# Patient Record
Sex: Female | Born: 1970 | Race: Black or African American | Hispanic: No | Marital: Single
Health system: Midwestern US, Academic
[De-identification: ages and names within clinical notes are randomized; demographics above are authoritative.]

## PROBLEM LIST (undated history)

## (undated) DIAGNOSIS — D259 Leiomyoma of uterus, unspecified: Secondary | ICD-10-CM

---

## 1996-06-21 HISTORY — PX: TUBAL LIGATION: SHX77

## 2006-02-28 NOTE — Unmapped (Signed)
THE [facility] PATIENT NAME:   Deborah Shaffer, CHHIM                     MR #:  [id number] DATE OF BIRTH:  [birth year]                           ACCOUNT #:  [id number] ED PHYSICIAN:   Sheilah Mins, M.D.                     ROOM #: PRIMARY:        Referring Nonstaff                 NURSING   UNIT:  ED REFERRING:      Referring Nonstaff                 FC:  D DICTATED   BY:    Glenetta Borg, M.D.                ADMIT DATE:  02/27/2006 VISIT DATE:       02/27/2006                         DISCHARGE DATE:                         EMERGENCY DEPARTMENT DISCHARGE NOTE CHIEF COMPLAINT:  My partner has   Trichomonas. HISTORY OF PRESENT ILLNESS:  This is a 35 year old   African-American female who states that her partner called her to tell her   that he went to the doctor on Friday and was found to have Trichomonas.  She   states that she would also like to be treated for such.  States that he has   been her only partner for the past few years.  States that they do not use   condoms.  She did not believe he had any other partners until he told her   this.  She has not had any vaginal discharge.  She has not had any dysuria.    She has not had any hematuria.  She is currently on her menstrual cycle and is   having vaginal bleeding.  She denies any abdominal pain, denies any nausea or   vomiting, denies any fevers.  States that she also has noticed a small sore   in her mouth and was worried that maybe she caught something from oral sex.    She denies any trouble swallowing, any odd taste in her mouth.  She denies any   other complaints. PAST MEDICAL HISTORY:  None. MEDICATIONS:  The patient   takes no medications. ALLERGIES:  She had no allergies. SOCIAL HISTORY:  She   smokes a half pack per day.  Does not drink alcohol, does not use illicit   drugs. REVIEW OF SYSTEMS:  Negative other than that stated in the HPI.   PHYSICAL EXAMINATION: VITAL SIGNS:  BP 111/72, pulse 77, respirations 14,      temperature 98.4, saturation 98% on room air. GENERAL:  This is a   well-developed, well-appearing African-American female who is sitting up   comfortably in bed.  She is in no acute distress. HEENT:  Pupils equal, round   and reactive to light.  Extraocular movements are intact.  There is no nasal   drainage.  The patient does have a small sore to  her inner lower lip.  It   appears to be a cold sore.  It is not actively oozing at this time.  Remainder   of oropharynx is without any erythema or exudate. NECK:  Supple. ABDOMEN:    Soft, nontender, nondistended. SKIN:  Warm and well-perfused. PELVIC:  The   patient does not have any vaginal discharge.  She does have moderate amount of   bright red blood in her vaginal vault.  She is not actively hemorrhaging.    She has no cervical motion tenderness, no adnexal tenderness, no palpable   fibroids. NEUROLOGIC:  She is alert and oriented to person, place, time and   situation. She is answering questions appropriately.  She moves all   extremities well. EMERGENCY DEPARTMENT COURSE:  The patient remained   comfortable during her stay here in the emergency department.  She was seen   and evaluated by myself and attending physician, Dr. Sheilah Mins and was   discharged home in good condition.  The patient did receive a total of 125 mg   IM Rocephin, 1 g of po, azithromycin, and 2 g of po, Flagyl. MEDICAL DECISION   MAKING:  I do not feel like this patient is at risk for PID. She has no   vaginal discharge, no fever, no abdominal pain, and no cervical motion   tenderness.  I do feel like it is prudent to treat this patient for STD   exposure as she states that her boyfriend was diagnosed with Trichomonas and   she has been having unprotected sex with him.  I do not believe she has an   acute abdomen at this time or any signs of systemic spread of infection. She   has no signs of infection in her urine to suggest a UTI. DIAGNOSIS: 1. STD   exposure. DISPOSITION:  Home.  CONDITION:  Good. PLAN: 1. The patient is   instructed that she should not have sexual intercourse for    the next two   weeks. 2. She is instructed that she should use condoms at all times to   protect    herself from future infections. 3. She is instructed to return to   the emergency department for any increase    in vaginal discharge, any fevers,   any abdominal pain, any persistent    vomiting or any other concerns.                                         ________________________________________ CE/trw                                  ____ D:  02/27/2006 10:38                    Glenetta Borg, M.D. T:  02/28/2006 09:00 Job #:  [id number]                                         ________________________________________  ____                                       Sheilah Mins, M.D.                         EMERGENCY DEPARTMENT DISCHARGE NOTE                                         COPY                   PAGE    1 of 1                                         Sheilah Mins, M.D.                       EMERGENCY DEPARTMENT   DISCHARGE NOTE                                       COPY                     PAGE    1 of 1

## 2006-03-03 NOTE — Unmapped (Signed)
THE [facility] PATIENT NAME:   Deborah Shaffer, Deborah Shaffer                     MR #:  [id number] DATE OF BIRTH:  04/12/1971                           ACCOUNT #:  [id number] ED PHYSICIAN:   Sheilah Mins, M.D.                     ROOM #: PRIMARY:        Referring Nonstaff                 NURSING   UNIT:  ED REFERRING:      Referring Nonstaff                 FC:  D DICTATED   BY:    Sheilah Mins, M.D.                   ADMIT DATE:  02/27/2006 VISIT DATE:       02/27/2006                         DISCHARGE DATE:                         EMERGENCY DEPARTMENT DISCHARGE NOTE ADDENDUM, 02/27/06, jm I saw and examined   this patient.  I agree with the history, physical exam, diagnosis and   treatment as outlined by Dr Lorrin Jackson.  Please see Dr Lucila Maine dictation for   details.                                         ________________________________________ JM/tm                                   ____ D:  03/03/2006 11:35                  Sheilah Mins, M.D. T:  03/03/2006   18:12 Job #:  [id number]                       EMERGENCY DEPARTMENT DISCHARGE NOTE                                         COPY                   PAGE    1 of 1                         EMERGENCY DEPARTMENT DISCHARGE NOTE                                         COPY                   PAGE    1 of 1

## 2013-06-21 DIAGNOSIS — D259 Leiomyoma of uterus, unspecified: Secondary | ICD-10-CM

## 2013-06-21 HISTORY — DX: Leiomyoma of uterus, unspecified: D25.9

## 2013-07-15 ENCOUNTER — Inpatient Hospital Stay
Admit: 2013-07-15 | Discharge: 2013-07-15 | Disposition: A | Discharge status: Home / Self Care | Payer: PRIVATE HEALTH INSURANCE

## 2013-07-15 DIAGNOSIS — R1013 Epigastric pain: Secondary | ICD-10-CM

## 2013-07-15 LAB — URINALYSIS, MICROSCOPIC
RBC, UA: 1 /HPF (ref 0–3)
Squam Epithel, UA: 1 /HPF (ref 0–5)
WBC, UA: 1 /HPF (ref 0–5)

## 2013-07-15 LAB — BASIC METABOLIC PANEL
Anion Gap: 4 mmol/L (ref 3–16)
BUN: 4 mg/dL (ref 7–25)
CO2: 26 mmol/L (ref 21–31)
Calcium: 9.2 mg/dL (ref 8.6–10.3)
Chloride: 106 mmol/L (ref 98–110)
Creatinine: 0.58 mg/dL (ref 0.60–1.30)
GFR MDRD Af Amer: 137 See note.
GFR MDRD Non Af Amer: 113 See note.
Glucose: 85 mg/dL (ref 70–100)
Osmolality, Calculated: 278 mOsm/kg (ref 278–305)
Potassium: 3.7 mmol/L (ref 3.5–5.3)
Sodium: 136 mmol/L (ref 135–146)

## 2013-07-15 LAB — DIFFERENTIAL
Basophils Absolute: 86 /uL (ref 0–200)
Basophils Relative: 1 % (ref 0.0–1.0)
Eosinophils Absolute: 77 /uL (ref 15–500)
Eosinophils Relative: 0.9 % (ref 0.0–8.0)
Lymphocytes Absolute: 2666 /uL (ref 850–3900)
Lymphocytes Relative: 31 % (ref 15.0–45.0)
Monocytes Absolute: 413 /uL (ref 200–950)
Monocytes Relative: 4.8 % (ref 0.0–12.0)
Neutrophils Absolute: 5358 /uL (ref 1500–7800)
Neutrophils Relative: 62.3 % (ref 40.0–80.0)

## 2013-07-15 LAB — URINALYSIS-MACROSCOPIC W/REFLEX TO MICROSCOPIC
Bilirubin, UA: NEGATIVE
Glucose, UA: NEGATIVE mg/dL
Ketones, UA: NEGATIVE mg/dL
Leukocytes, UA: NEGATIVE
Nitrite, UA: NEGATIVE
Protein, UA: NEGATIVE mg/dL
Specific Gravity, UA: 1.015 (ref 1.005–1.035)
Urobilinogen, UA: 0.2 EU/dL (ref 0.2–1.0)
pH, UA: 7 (ref 5.0–8.0)

## 2013-07-15 LAB — HEPATIC FUNCTION PANEL
ALT: 9 U/L (ref 7–52)
AST: 15 U/L (ref 13–39)
Albumin: 4 g/dL (ref 3.5–5.7)
Alkaline Phosphatase: 40 U/L (ref 34–104)
Bilirubin, Direct: 0.05 mg/dL (ref 0.03–0.18)
Bilirubin, Indirect: 0.25 mg/dL (ref 0.20–0.70)
Total Bilirubin: 0.3 mg/dL (ref 0.3–1.2)
Total Protein: 6.4 g/dL (ref 6.4–8.9)

## 2013-07-15 LAB — CBC
Hematocrit: 36 % (ref 35.0–45.0)
Hemoglobin: 12.4 g/dL (ref 11.7–15.5)
MCH: 31.9 pg (ref 27.0–33.0)
MCHC: 34.3 g/dL (ref 32.0–36.0)
MCV: 93 fL (ref 80.0–100.0)
MPV: 7.8 fL (ref 7.5–11.5)
Platelets: 261 10*3/uL (ref 140–400)
RBC: 3.88 10*6/uL (ref 3.80–5.10)
RDW: 12.6 % (ref 11.0–15.0)
WBC: 8.6 10*3/uL (ref 3.8–10.8)

## 2013-07-15 LAB — HCG URINE, QUALITATIVE: Preg Test, Ur: NEGATIVE

## 2013-07-15 LAB — LIPASE: Lipase: 13 U/L (ref 11–82)

## 2013-07-15 MED ORDER — lidocaine HCl (XYLOCAINE) 2 % viscous soln Soln 15 mL
2 | Freq: Once | Status: AC
Start: 2013-07-15 — End: 2013-07-15
  Administered 2013-07-15: 20:00:00 15 mL via ORAL

## 2013-07-15 MED ORDER — aluminum & magnesium hydroxide-simethicone (MYLANTA, MAALOX) suspension 30 mL
400-400-40 | Freq: Once | ORAL | Status: AC
Start: 2013-07-15 — End: 2013-07-15
  Administered 2013-07-15: 20:00:00 30 mL via ORAL

## 2013-07-15 MED ORDER — promethazine (PHENERGAN) 25 MG tablet
25 | ORAL_TABLET | Freq: Four times a day (QID) | ORAL | Status: AC | PRN
Start: 2013-07-15 — End: ?

## 2013-07-15 MED ORDER — omeprazole (PRILOSEC) 20 MG capsule
20 | ORAL_CAPSULE | Freq: Every morning | ORAL | Status: AC
Start: 2013-07-15 — End: ?

## 2013-07-15 MED FILL — MAG-AL PLUS EXTRA STRENGTH 400 MG-400 MG-40 MG/5 ML ORAL SUSPENSION: 400-400-40 400-400-40 mg/5 mL | ORAL | Qty: 30

## 2013-07-15 MED FILL — LIDOCAINE VISCOUS 2 % MUCOSAL SOLUTION: 2 2 % | Qty: 15

## 2013-07-15 NOTE — Unmapped (Signed)
Take omeprazole as prescribed for acid reflux. Take phenergan as prescribed for nausea. This medication will make you sleepy, so do not drive or operate heavy machinery after taking this medication. Drink lots of fluids to avoid dehydration.

## 2013-07-15 NOTE — Unmapped (Signed)
ED Screening Protocol - Yes

## 2013-07-15 NOTE — Unmapped (Signed)
ED Attending Attestation Note    Date of service:  07/15/2013    This patient was seen by the resident physician.  I have seen and examined the patient, agree with the workup, evaluation, management and diagnosis. The care plan has been discussed and I concur.     My assessment reveals a 43 y.o. female epigastric cramping no hematemesis or melena abdomen soft with mild epigastric tenderness no rebound or peritoneal signs

## 2013-07-15 NOTE — Unmapped (Signed)
Pt states since Wednesday she has had abdominal cramping as well as having something like tissue in her poop.  Denies any blood in her stool.  +nausea no vomiting.   Denies any trouble with urination.

## 2013-07-15 NOTE — Unmapped (Signed)
Pt verbalized instant pain relief with administration of GI cocktail.  Pt updated on plan of care, call light at bedside.

## 2013-07-15 NOTE — Unmapped (Signed)
[facility] ED Note    Reason for Visit: Abdominal Cramping      Patient History     HPI: Deborah Shaffer is a 43 y.o. female with past medical history significant for tubal ligation who presents with a complaint of abdominal pain and cramping. The patient complains of epigastric abdominal pain for the last 5 days. She states that it is intermittent and comes on after eating and lasts for around an hour. She endorses associated nausea but denies vomiting. She complains of passing something in her stool that looked like tissue. She is unable to describe it further. She denies any diarrhea, constipation, melena or hematochezia. She denies dysuria, hematuria, fevers, or chills. No history of similar symptoms in the past. She states that the pain is 7/10 in severity, burning and cramping, non-radiating, with no other significant aggravating or relieving factors.      History reviewed. No pertinent past medical history.    Past Surgical History   Procedure Laterality Date   ??? Tubal ligation         Deborah Shaffer  reports that she has been smoking Cigarettes.  She has been smoking about 0.25 packs per day. She does not have any smokeless tobacco history on file. She reports that she drinks alcohol. She reports that she does not use illicit drugs.    Previous Medications    No medications on file       Allergies:   Allergies as of 07/15/2013   ??? (No Known Allergies)       Review of Systems     Constitutional:  Denies fever or chills   Eyes:  Denies change in visual acuity   HENT:  Denies nasal congestion or sore throat   Respiratory:  Denies cough or shortness of breath   Cardiovascular:  Denies chest pain or lower extremity edema   GI:  Positive for abdominal pain, nausea. Denies vomiting, melena, hematochezia, constipation, or diarrhea   GU:  Denies dysuria or gross hematuria  Musculoskeletal:  Denies joint pain   Integument:  Denies rash   Neurologic:  Denies headache,  focal weakness or sensory changes   Endocrine:  Denies polyuria or polydipsia   Psychiatric:  Denies suicidal or homicidal ideation      Physical Exam     ED Triage Vitals   Vital Signs Group      Temp 07/15/13 1348 98 ??F (36.7 ??C)      Temp Source 07/15/13 1348 Oral      Heart Rate 07/15/13 1348 72      Heart Rate Source 07/15/13 1348 Automatic      Resp 07/15/13 1348 18      BP 07/15/13 1348 125/87 mmHg      BP Location 07/15/13 1348 Right arm      BP Method 07/15/13 1348 Automatic      Patient Position 07/15/13 1348 Sitting   SpO2 07/15/13 1348 96 %   O2 Device 07/15/13 1348 None (Room air)     Filed Vitals:    07/15/13 1348   BP: 125/87   Pulse: 72   Temp: 98 ??F (36.7 ??C)   TempSrc: Oral   Resp: 18   SpO2: 96%        Constitutional:  Well developed, well nourished, no acute distress, non-toxic appearance. Sitting comfortably on the stretcher.   Eyes:  PERRL, anicteric sclera  HENT:  Normocephalic, atraumatic. Oropharynx is without lesions or erythema. Mucous membranes moist. No sinus tenderness on palpation.  Neck: Supple. Trachea midline.   Respiratory:  No respiratory distress. Clear to auscultation bilaterally. No crackles or wheezes. No tripoding, no accessory muscle use.  Cardiovascular:  Regular rate and rhythm. Clear S1 and S2. No murmurs, rubs, or gallops were appreciated on exam. Radial pulses 2+ bilaterally.  GI:  Normoactive bowel sounds. Soft, mild epigastric tenderness, otherwise non-tender, non-distended. No organomegaly or masses appreciated on exam. No rebound or guarding. Normotympanic.  Back:  No costovertebral angle tenderness   GU: No gross blood on rectal exam. Soft, brown stool. No appreciable masses. No fissures.   Musculoskeletal:  No edema, no tenderness, no gross deformities.   Integument:  Well hydrated, no rash   Neurologic:  Alert, answering questions appropriately, moving all extremities. Patient is able to ambulate with normal gait and stride.  Psychiatric:  Speech and behavior  appropriate         Diagnostic Studies     Labs:    Please see electronic medical record for any tests performed in the ED     Radiology:    No tests were performed during this ED visit        Emergency Department Procedures     RIGHT UPPER QUADRANT ULTRASOUND: A limited, bedside ultrasound of the right upper quadrant was performed. The medical necessity was to evaluate for gallstones or sonographic signs of cholecystitis. The structures studied were the gallbladder and liver.    FINDINGS:  Stones: No  Sludge: No  Pericholecystic Fluid: No  Wall thickness: Normal  CBD: Normal    No evidence of cholelithiasis or cholecystitis    ED Course and MDM     Deborah Shaffer is a 43 y.o. female who presented to the emergency department with Abdominal Cramping   The patient was admitted to the emergency department and assessed in conjunction with the attending ED physician. The patient's abdominal pain is most likely secondary to GERD versus gastritis versus peptic ulcer disease. She was given a GI cocktail with relief of her symptoms. Urinalysis is non-concerning for infection. Urine pregnancy test is negative, so low concern for ectopic pregnancy. Lipase is within normal limits, so low concern for acute pancreatitis. Transaminases are within normal limits, so low concern for acute hepatitis. Bedside ultrasound is negative for cholecystitis and the patient is afebrile. No significant electrolyte abnormalities. The patient is able to tolerate oral intake without difficulty. Her overall abdominal exam is benign, so low concern for obstruction or emergent intra-abdominal pathology. No evidence of GI bleed. It is unclear what passed in the patient's stool, but she had a normal rectal exam.    Clinical Impression:  1. Abdominal pain  2. Nausea    Disposition: Home    Plan:  1. Follow-up with primary doctor as soon as possible  2. Drink lots of fluids to prevent dehydration  3. Phenergan as prescribed for nausea.  4. Omeprazole as  prescribed for acid reflux  5. Return to the ER for significantly worsening pain, persistent vomiting, fevers, or any other concerns.         Darnelle Maffucci, MD  Resident  07/28/13 [phone]

## 2015-08-11 ENCOUNTER — Encounter (HOSPITAL_BASED_OUTPATIENT_CLINIC_OR_DEPARTMENT_OTHER): Payer: Self-pay | Admitting: *Deleted

## 2015-08-11 ENCOUNTER — Emergency Department (HOSPITAL_BASED_OUTPATIENT_CLINIC_OR_DEPARTMENT_OTHER): Payer: Self-pay

## 2015-08-11 ENCOUNTER — Emergency Department (HOSPITAL_BASED_OUTPATIENT_CLINIC_OR_DEPARTMENT_OTHER)
Admission: EM | Admit: 2015-08-11 | Discharge: 2015-08-11 | Disposition: A | Discharge status: Home / Self Care | Payer: Self-pay | Attending: Emergency Medicine | Admitting: Emergency Medicine

## 2015-08-11 DIAGNOSIS — D500 Iron deficiency anemia secondary to blood loss (chronic): Secondary | ICD-10-CM | POA: Insufficient documentation

## 2015-08-11 DIAGNOSIS — Z9851 Tubal ligation status: Secondary | ICD-10-CM | POA: Insufficient documentation

## 2015-08-11 DIAGNOSIS — Z3202 Encounter for pregnancy test, result negative: Secondary | ICD-10-CM | POA: Insufficient documentation

## 2015-08-11 DIAGNOSIS — N938 Other specified abnormal uterine and vaginal bleeding: Secondary | ICD-10-CM | POA: Insufficient documentation

## 2015-08-11 DIAGNOSIS — F17210 Nicotine dependence, cigarettes, uncomplicated: Secondary | ICD-10-CM | POA: Insufficient documentation

## 2015-08-11 DIAGNOSIS — D259 Leiomyoma of uterus, unspecified: Secondary | ICD-10-CM | POA: Insufficient documentation

## 2015-08-11 LAB — CBC WITH DIFFERENTIAL/PLATELET
BASOS PCT: 1 %
Basophils Absolute: 0.1 10*3/uL (ref 0.0–0.1)
EOS ABS: 0.3 10*3/uL (ref 0.0–0.7)
EOS PCT: 5 %
HCT: 32.7 % — ABNORMAL LOW (ref 36.0–46.0)
HEMOGLOBIN: 10.6 g/dL — AB (ref 12.0–15.0)
Lymphocytes Relative: 21 %
Lymphs Abs: 1.4 10*3/uL (ref 0.7–4.0)
MCH: 28.7 pg (ref 26.0–34.0)
MCHC: 32.4 g/dL (ref 30.0–36.0)
MCV: 88.6 fL (ref 78.0–100.0)
MONOS PCT: 9 %
Monocytes Absolute: 0.6 10*3/uL (ref 0.1–1.0)
NEUTROS PCT: 64 %
Neutro Abs: 4.3 10*3/uL (ref 1.7–7.7)
PLATELETS: 272 10*3/uL (ref 150–400)
RBC: 3.69 MIL/uL — ABNORMAL LOW (ref 3.87–5.11)
RDW: 14.8 % (ref 11.5–15.5)
WBC: 6.7 10*3/uL (ref 4.0–10.5)

## 2015-08-11 LAB — PREGNANCY, URINE: PREG TEST UR: NEGATIVE

## 2015-08-11 LAB — COMPREHENSIVE METABOLIC PANEL
ALK PHOS: 40 U/L (ref 38–126)
ALT: 10 U/L — AB (ref 14–54)
AST: 16 U/L (ref 15–41)
Albumin: 3.5 g/dL (ref 3.5–5.0)
Anion gap: 4 — ABNORMAL LOW (ref 5–15)
BUN: 7 mg/dL (ref 6–20)
CALCIUM: 8.8 mg/dL — AB (ref 8.9–10.3)
CO2: 27 mmol/L (ref 22–32)
CREATININE: 0.53 mg/dL (ref 0.44–1.00)
Chloride: 107 mmol/L (ref 101–111)
GFR calc non Af Amer: >60 mL/min (ref >60)
Glucose, Bld: 96 mg/dL (ref 65–99)
Potassium: 4.2 mmol/L (ref 3.5–5.1)
SODIUM: 138 mmol/L (ref 135–145)
Total Bilirubin: 0.4 mg/dL (ref 0.3–1.2)
Total Protein: 6.2 g/dL — ABNORMAL LOW (ref 6.5–8.1)

## 2015-08-11 LAB — URINALYSIS, ROUTINE W REFLEX MICROSCOPIC
BILIRUBIN URINE: NEGATIVE
Glucose, UA: NEGATIVE mg/dL
KETONES UR: NEGATIVE mg/dL
Leukocytes, UA: NEGATIVE
NITRITE: NEGATIVE
PH: 5.5 (ref 5.0–8.0)
Protein, ur: NEGATIVE mg/dL
Specific Gravity, Urine: 1.019 (ref 1.005–1.030)

## 2015-08-11 LAB — WET PREP, GENITAL
Clue Cells Wet Prep HPF POC: NONE SEEN
SPERM: NONE SEEN
Trich, Wet Prep: NONE SEEN
Yeast Wet Prep HPF POC: NONE SEEN

## 2015-08-11 LAB — URINE MICROSCOPIC-ADD ON

## 2015-08-11 LAB — PROTIME-INR
INR: 0.99 (ref 0.00–1.49)
Prothrombin Time: 13.3 seconds (ref 11.6–15.2)

## 2015-08-11 MED ORDER — TRAMADOL HCL 50 MG PO TABS: 100.0000 mg | ORAL_TABLET | Freq: Four times a day (QID) | ORAL | Status: DC | PRN

## 2015-08-11 MED ORDER — ACETAMINOPHEN 500 MG PO TABS: 1000.0000 mg | ORAL_TABLET | Freq: Four times a day (QID) | ORAL | Status: AC | PRN

## 2015-08-11 MED ORDER — POLYSACCHARIDE IRON COMPLEX 150 MG PO CAPS: 150.0000 mg | ORAL_CAPSULE | Freq: Every day | ORAL | Status: AC

## 2015-08-11 NOTE — ED Notes (Signed)
C/o low abd and back pain. Has had issues with constipation. Pt passed large clots this am. No discharge.  Onset yesterday.

## 2015-08-11 NOTE — ED Provider Notes (Signed)
CSN: [id number]     Arrival date & time 08/11/15  0714 History   First MD Initiated Contact with Patient 08/11/15 0725     Chief Complaint  Patient presents with  . Vaginal Bleeding     (Consider location/radiation/quality/duration/timing/severity/associated sxs/prior Treatment) HPI Patient reports lower abdominal pain and lower back pain starting yesterday evening. She reports that she soaked through her pants with vaginal bleeding. She went to the bathroom and passed several large clots. She reports this is atypical for her. She had a normal menstrual cycle approximately 2\3. This lasted approximately 3 days which again she reports is typical for her. She denies history of heavy vaginal bleeding or pain. Patient reports she has bilateral tubal ligation. Does not use other birth control. No other regular medications. She took some BC powder yesterday evening for pelvic pain. No recent vomiting or diarrhea. Patient denies any known history of anemia. As an aside, she mentions that for the past month she has been eating a lot of ice but doesn't know if that has anything to do with it. History reviewed. No pertinent past medical history. History reviewed. No pertinent past surgical history. No family history on file. Social History  Substance Use Topics  . Smoking status: Current Every Day Smoker -- 0.50 packs/day    Types: Cigarettes  . Smokeless tobacco: None  . Alcohol Use: None   OB History    No data available     Review of Systems  10 Systems reviewed and are negative for acute change except as noted in the HPI.   Allergies  Review of patient's allergies indicates no known allergies.  Home Medications   Prior to Admission medications   Medication Sig Start Date End Date Taking? Authorizing Provider  acetaminophen (TYLENOL) 500 MG tablet Take 2 tablets (1,000 mg total) by mouth every 6 (six) hours as needed. 08/11/15   Charlesetta Shanks, MD  iron polysaccharides (NIFEREX) 150 MG  capsule Take 1 capsule (150 mg total) by mouth daily. 08/11/15   Charlesetta Shanks, MD  traMADol (ULTRAM) 50 MG tablet Take 2 tablets (100 mg total) by mouth every 6 (six) hours as needed for moderate pain (1-2 tablets every 6 hours as needed.). 08/11/15   Charlesetta Shanks, MD   BP 116/78 mmHg  Pulse 73  Temp(Src) 98.5 F (36.9 C) (Oral)  Resp 18  Ht 5\' 8"  (1.727 m)  Wt 140 lb (63.504 kg)  BMI 21.29 kg/m2  SpO2 100%  LMP 07/28/2015 Physical Exam  Constitutional: She is oriented to person, place, and time. She appears well-developed and well-nourished.  HENT:  Head: Normocephalic and atraumatic.  Nose: Nose normal.  Mouth/Throat: Oropharynx is clear and moist.  Eyes: EOM are normal. Pupils are equal, round, and reactive to light.  Neck: Neck supple.  Cardiovascular: Normal rate, regular rhythm, normal heart sounds and intact distal pulses.   Pulmonary/Chest: Effort normal and breath sounds normal.  Abdominal: Soft. Bowel sounds are normal. She exhibits no distension. There is tenderness.  Mild tenderness to palpation in the lower and central abdomen. No guarding or rebound.  Genitourinary: Vagina normal.  Normal external female genitalia. Small pool of dark blood in the vaginal vault. No clots. Cervix is normal in appearance. At this time, clear mucus with blood streaked. Bimanual examination no cervical motion tenderness. Moderate uterine enlargement with mild tenderness. No adnexal masses.  Musculoskeletal: Normal range of motion. She exhibits no edema or tenderness.  Neurological: She is alert and oriented to person, place, and  time. She has normal strength. Coordination normal. GCS eye subscore is 4. GCS verbal subscore is 5. GCS motor subscore is 6.  Skin: Skin is warm, dry and intact.  Psychiatric: She has a normal mood and affect.    ED Course  Procedures (including critical care time) Labs Review Labs Reviewed  URINALYSIS, ROUTINE W REFLEX MICROSCOPIC (NOT AT [facility]) - Abnormal;  Notable for the following:    Hgb urine dipstick LARGE (*)    All other components within normal limits  URINE MICROSCOPIC-ADD ON - Abnormal; Notable for the following:    Squamous Epithelial / LPF 0-5 (*)    Bacteria, UA MANY (*)    All other components within normal limits  COMPREHENSIVE METABOLIC PANEL - Abnormal; Notable for the following:    Calcium 8.8 (*)    Total Protein 6.2 (*)    ALT 10 (*)    Anion gap 4 (*)    All other components within normal limits  CBC WITH DIFFERENTIAL/PLATELET - Abnormal; Notable for the following:    RBC 3.69 (*)    Hemoglobin 10.6 (*)    HCT 32.7 (*)    All other components within normal limits  WET PREP, GENITAL  PREGNANCY, URINE  PROTIME-INR  GC/CHLAMYDIA PROBE AMP ([place]) NOT AT [facility]    Imaging Review US Transvaginal Non-ob  08/11/2015  CLINICAL DATA:  Pelvic and low back pain and vaginal bleeding for 1 day. EXAM: TRANSABDOMINAL AND TRANSVAGINAL ULTRASOUND OF PELVIS TECHNIQUE: Both transabdominal and transvaginal ultrasound examinations of the pelvis were performed. Transabdominal technique was performed for global imaging of the pelvis including uterus, ovaries, adnexal regions, and pelvic cul-de-sac. It was necessary to proceed with endovaginal exam following the transabdominal exam to visualize the endometrium and ovaries. COMPARISON:  None. FINDINGS: Uterus Measurements: 12.1 x 9.6 x 7.5 cm. Three rounded myometrial masses. The uterus is heterogeneous with at least 3 distinct rounded myometrial masses. One is in the anterior body on the right, measuring 2.5 x 2.3 x 2.1 cm. Another is in the posterior midbody on the right, measuring 1.9 x 1.9 x 1.7 cm. The other is in the anterior body on the left, measuring 2.3 x 1.9 x 1.7 cm. No definite submucosal components are identified. Endometrium Thickness: 13.5 mm.  No focal abnormality visualized. Right ovary Measurements: 3.2 x 2.7 x 2.3 cm. Normal appearance/no adnexal mass. Left ovary  Measurements: 2.5 x 1.9 x 1.8 cm. Normal appearance/no adnexal mass. Other findings Trace amount of free peritoneal fluid, within normal limits of physiological fluid. IMPRESSION: 1. Enlarged, heterogeneous uterus containing at least 3 distinct fibroids, as described above. No definite submucosal components were identified. 2. Normal appearing ovaries. Electronically Signed   By: Claudie Revering M.D.   On: 08/11/2015 09:01   US Pelvis Complete  08/11/2015  CLINICAL DATA:  Pelvic and low back pain and vaginal bleeding for 1 day. EXAM: TRANSABDOMINAL AND TRANSVAGINAL ULTRASOUND OF PELVIS TECHNIQUE: Both transabdominal and transvaginal ultrasound examinations of the pelvis were performed. Transabdominal technique was performed for global imaging of the pelvis including uterus, ovaries, adnexal regions, and pelvic cul-de-sac. It was necessary to proceed with endovaginal exam following the transabdominal exam to visualize the endometrium and ovaries. COMPARISON:  None. FINDINGS: Uterus Measurements: 12.1 x 9.6 x 7.5 cm. Three rounded myometrial masses. The uterus is heterogeneous with at least 3 distinct rounded myometrial masses. One is in the anterior body on the right, measuring 2.5 x 2.3 x 2.1 cm. Another is in the posterior midbody  on the right, measuring 1.9 x 1.9 x 1.7 cm. The other is in the anterior body on the left, measuring 2.3 x 1.9 x 1.7 cm. No definite submucosal components are identified. Endometrium Thickness: 13.5 mm.  No focal abnormality visualized. Right ovary Measurements: 3.2 x 2.7 x 2.3 cm. Normal appearance/no adnexal mass. Left ovary Measurements: 2.5 x 1.9 x 1.8 cm. Normal appearance/no adnexal mass. Other findings Trace amount of free peritoneal fluid, within normal limits of physiological fluid. IMPRESSION: 1. Enlarged, heterogeneous uterus containing at least 3 distinct fibroids, as described above. No definite submucosal components were identified. 2. Normal appearing ovaries.  Electronically Signed   By: Claudie Revering M.D.   On: 08/11/2015 09:01   I have personally reviewed and evaluated these images and lab results as part of my medical decision-making.   EKG Interpretation None      MDM   Final diagnoses:  Uterine leiomyoma, unspecified location  Dysfunctional uterine bleeding  Iron deficiency anemia due to chronic blood loss   Uterine fibroids are identified as cause of dysfunctional uterine bleeding. Patient appears to have developed anemia, likely over longer period of time. Her vital signs are stable and she is not orthostatic. Patient describes PICA with increased eating ice for a months duration. Patient is otherwise well without medical problems. At this time, she is given tramadol for pain control and Tylenol. Iron supplement as prescribed. She is counseled on anemia and fibroids. Patient is counseled on following up at [facility] hospital for definitive treatment.    Charlesetta Shanks, MD 08/11/15 [phone]

## 2015-08-11 NOTE — Discharge Instructions (Signed)
Uterine Fibroids Uterine fibroids are tissue masses (tumors) that can develop in the womb (uterus). They are also called leiomyomas. This type of tumor is not cancerous (benign) and does not spread to other parts of the body outside of the pelvic area, which is between the hip bones. Occasionally, fibroids may develop in the fallopian tubes, in the cervix, or on the support structures (ligaments) that surround the uterus. You can have one or many fibroids. Fibroids can vary in size, weight, and where they grow in the uterus. Some can become quite large. Most fibroids do not require medical treatment. CAUSES A fibroid can develop when a single uterine cell keeps growing (replicating). Most cells in the human body have a control mechanism that keeps them from replicating without control. SIGNS AND SYMPTOMS Symptoms may include:   Heavy bleeding during your period.  Bleeding or spotting between periods.  Pelvic pain and pressure.  Bladder problems, such as needing to urinate more often (urinary frequency) or urgently.  Inability to reproduce offspring (infertility).  Miscarriages. DIAGNOSIS Uterine fibroids are diagnosed through a physical exam. Your health care provider may feel the lumpy tumors during a pelvic exam. Ultrasonography and an MRI may be done to determine the size, location, and number of fibroids. TREATMENT Treatment may include:  Watchful waiting. This involves getting the fibroid checked by your health care provider to see if it grows or shrinks. Follow your health care provider's recommendations for how often to have this checked.  Hormone medicines. These can be taken by mouth or given through an intrauterine device (IUD).  Surgery.  Removing the fibroids (myomectomy) or the uterus (hysterectomy).  Removing blood supply to the fibroids (uterine artery embolization). If fibroids interfere with your fertility and you want to become pregnant, your health care provider  may recommend having the fibroids removed.  HOME CARE INSTRUCTIONS  Keep all follow-up visits as directed by your health care provider. This is important.  Take medicines only as directed by your health care provider.  If you were prescribed a hormone treatment, take the hormone medicines exactly as directed.  Do not take aspirin, because it can cause bleeding.  Ask your health care provider about taking iron pills and increasing the amount of dark green, leafy vegetables in your diet. These actions can help to boost your blood iron levels, which may be affected by heavy menstrual bleeding.  Pay close attention to your period and tell your health care provider about any changes, such as:  Increased blood flow that requires you to use more pads or tampons than usual per month.  A change in the number of days that your period lasts per month.  A change in symptoms that are associated with your period, such as abdominal cramping or back pain. SEEK MEDICAL CARE IF:  You have pelvic pain, back pain, or abdominal cramps that cannot be controlled with medicines.  You have an increase in bleeding between and during periods.  You soak tampons or pads in a half hour or less.  You feel lightheaded, extra tired, or weak. SEEK IMMEDIATE MEDICAL CARE IF:  You faint.  You have a sudden increase in pelvic pain.   This information is not intended to replace advice given to you by your health care provider. Make sure you discuss any questions you have with your health care provider.   Document Released: 06/04/2000 Document Revised: 06/28/2014 Document Reviewed: 12/04/2013 Elsevier Interactive Patient Education 2016 [employer] Anemia, Nonspecific Anemia is a condition in  which the concentration of red blood cells or hemoglobin in the blood is below normal. Hemoglobin is a substance in red blood cells that carries oxygen to the tissues of the body. Anemia results in not enough oxygen reaching  these tissues.  CAUSES  Common causes of anemia include:   Excessive bleeding. Bleeding may be internal or external. This includes excessive bleeding from periods (in women) or from the intestine.   Poor nutrition.   Chronic kidney, thyroid, and liver disease.  Bone marrow disorders that decrease red blood cell production.  Cancer and treatments for cancer.  HIV, AIDS, and their treatments.  Spleen problems that increase red blood cell destruction.  Blood disorders.  Excess destruction of red blood cells due to infection, medicines, and autoimmune disorders. SIGNS AND SYMPTOMS   Minor weakness.   Dizziness.   Headache.  Palpitations.   Shortness of breath, especially with exercise.   Paleness.  Cold sensitivity.  Indigestion.  Nausea.  Difficulty sleeping.  Difficulty concentrating. Symptoms may occur suddenly or they may develop slowly.  DIAGNOSIS  Additional blood tests are often needed. These help your health care provider determine the best treatment. Your health care provider will check your stool for blood and look for other causes of blood loss.  TREATMENT  Treatment varies depending on the cause of the anemia. Treatment can include:   Supplements of iron, vitamin 123456, or folic acid.   Hormone medicines.   A blood transfusion. This may be needed if blood loss is severe.   Hospitalization. This may be needed if there is significant continual blood loss.   Dietary changes.  Spleen removal. HOME CARE INSTRUCTIONS Keep all follow-up appointments. It often takes many weeks to correct anemia, and having your health care provider check on your condition and your response to treatment is very important. SEEK IMMEDIATE MEDICAL CARE IF:   You develop extreme weakness, shortness of breath, or chest pain.   You become dizzy or have trouble concentrating.  You develop heavy vaginal bleeding.   You develop a rash.   You have bloody or  black, tarry stools.   You faint.   You vomit up blood.   You vomit repeatedly.   You have abdominal pain.  You have a fever or persistent symptoms for more than 2-3 days.   You have a fever and your symptoms suddenly get worse.   You are dehydrated.  MAKE SURE YOU:  Understand these instructions.  Will watch your condition.  Will get help right away if you are not doing well or get worse.   This information is not intended to replace advice given to you by your health care provider. Make sure you discuss any questions you have with your health care provider.   Document Released: 07/15/2004 Document Revised: 02/07/2013 Document Reviewed: 12/01/2012 Elsevier Interactive Patient Education [employer].

## 2015-08-11 NOTE — ED Notes (Signed)
MD at bedside. 

## 2015-08-12 LAB — GC/CHLAMYDIA PROBE AMP (~~LOC~~) NOT AT ARMC
CHLAMYDIA, DNA PROBE: NEGATIVE
Neisseria Gonorrhea: NEGATIVE

## 2015-09-04 ENCOUNTER — Encounter: Payer: Self-pay | Admitting: Obstetrics & Gynecology

## 2016-07-24 ENCOUNTER — Emergency Department (HOSPITAL_BASED_OUTPATIENT_CLINIC_OR_DEPARTMENT_OTHER): Payer: BLUE CROSS/BLUE SHIELD

## 2016-07-24 ENCOUNTER — Encounter (HOSPITAL_BASED_OUTPATIENT_CLINIC_OR_DEPARTMENT_OTHER): Payer: Self-pay | Admitting: *Deleted

## 2016-07-24 ENCOUNTER — Emergency Department (HOSPITAL_BASED_OUTPATIENT_CLINIC_OR_DEPARTMENT_OTHER)
Admission: EM | Admit: 2016-07-24 | Discharge: 2016-07-24 | Disposition: A | Discharge status: Home / Self Care | Payer: BLUE CROSS/BLUE SHIELD | Attending: Emergency Medicine | Admitting: Emergency Medicine

## 2016-07-24 DIAGNOSIS — F17210 Nicotine dependence, cigarettes, uncomplicated: Secondary | ICD-10-CM | POA: Diagnosis not present

## 2016-07-24 DIAGNOSIS — D259 Leiomyoma of uterus, unspecified: Secondary | ICD-10-CM | POA: Diagnosis not present

## 2016-07-24 DIAGNOSIS — R102 Pelvic and perineal pain: Secondary | ICD-10-CM | POA: Diagnosis present

## 2016-07-24 DIAGNOSIS — Z79899 Other long term (current) drug therapy: Secondary | ICD-10-CM | POA: Diagnosis not present

## 2016-07-24 HISTORY — DX: Leiomyoma of uterus, unspecified: D25.9

## 2016-07-24 LAB — CBC WITH DIFFERENTIAL/PLATELET
Basophils Absolute: 0 10*3/uL (ref 0.0–0.1)
Basophils Relative: 0 %
Eosinophils Absolute: 0.1 10*3/uL (ref 0.0–0.7)
Eosinophils Relative: 1 %
HCT: 36.3 % (ref 36.0–46.0)
Hemoglobin: 12.2 g/dL (ref 12.0–15.0)
Lymphocytes Relative: 11 %
Lymphs Abs: 1.2 10*3/uL (ref 0.7–4.0)
MCH: 31 pg (ref 26.0–34.0)
MCHC: 33.6 g/dL (ref 30.0–36.0)
MCV: 92.4 fL (ref 78.0–100.0)
Monocytes Absolute: 0.6 10*3/uL (ref 0.1–1.0)
Monocytes Relative: 6 %
Neutro Abs: 8.8 10*3/uL — ABNORMAL HIGH (ref 1.7–7.7)
Neutrophils Relative %: 82 %
Platelets: 312 10*3/uL (ref 150–400)
RBC: 3.93 MIL/uL (ref 3.87–5.11)
RDW: 14 % (ref 11.5–15.5)
WBC: 10.7 10*3/uL — ABNORMAL HIGH (ref 4.0–10.5)

## 2016-07-24 LAB — URINALYSIS, ROUTINE W REFLEX MICROSCOPIC
Bilirubin Urine: NEGATIVE
Glucose, UA: NEGATIVE mg/dL
Ketones, ur: NEGATIVE mg/dL
Leukocytes, UA: NEGATIVE
Nitrite: NEGATIVE
Protein, ur: NEGATIVE mg/dL
Specific Gravity, Urine: 1.023 (ref 1.005–1.030)
pH: 6.5 (ref 5.0–8.0)

## 2016-07-24 LAB — URINALYSIS, MICROSCOPIC (REFLEX)

## 2016-07-24 LAB — COMPREHENSIVE METABOLIC PANEL
ALT: 13 U/L — ABNORMAL LOW (ref 14–54)
AST: 20 U/L (ref 15–41)
Albumin: 4 g/dL (ref 3.5–5.0)
Alkaline Phosphatase: 47 U/L (ref 38–126)
Anion gap: 7 (ref 5–15)
BUN: 7 mg/dL (ref 6–20)
CO2: 25 mmol/L (ref 22–32)
Calcium: 8.8 mg/dL — ABNORMAL LOW (ref 8.9–10.3)
Chloride: 107 mmol/L (ref 101–111)
Creatinine, Ser: 0.6 mg/dL (ref 0.44–1.00)
GFR calc Af Amer: >60 mL/min (ref >60)
GFR calc non Af Amer: >60 mL/min (ref >60)
Glucose, Bld: 102 mg/dL — ABNORMAL HIGH (ref 65–99)
Potassium: 4.1 mmol/L (ref 3.5–5.1)
Sodium: 139 mmol/L (ref 135–145)
Total Bilirubin: 0.5 mg/dL (ref 0.3–1.2)
Total Protein: 7.1 g/dL (ref 6.5–8.1)

## 2016-07-24 LAB — PREGNANCY, URINE: Preg Test, Ur: NEGATIVE

## 2016-07-24 MED ORDER — MORPHINE SULFATE (PF) 4 MG/ML IV SOLN
4.0000 mg | Freq: Once | INTRAVENOUS | Status: AC
  Administered 2016-07-24: 4 mg via INTRAVENOUS
  Filled 2016-07-24: qty 1

## 2016-07-24 MED ORDER — IBUPROFEN 800 MG PO TABS
800.0000 mg | ORAL_TABLET | Freq: Three times a day (TID) | ORAL | 0 refills | Status: AC | PRN
Start: 2016-07-24 — End: ?

## 2016-07-24 MED ORDER — KETOROLAC TROMETHAMINE 30 MG/ML IJ SOLN
30.0000 mg | Freq: Once | INTRAMUSCULAR | Status: AC
  Administered 2016-07-24: 30 mg via INTRAVENOUS
  Filled 2016-07-24: qty 1

## 2016-07-24 MED ORDER — SODIUM CHLORIDE 0.9 % IV BOLUS (SEPSIS)
1000.0000 mL | Freq: Once | INTRAVENOUS | Status: AC
  Administered 2016-07-24: 1000 mL via INTRAVENOUS

## 2016-07-24 MED ORDER — TRAMADOL HCL 50 MG PO TABS: 50.0000 mg | ORAL_TABLET | Freq: Four times a day (QID) | ORAL | 0 refills | Status: AC | PRN

## 2016-07-24 MED ORDER — ONDANSETRON HCL 4 MG/2ML IJ SOLN
4.0000 mg | Freq: Once | INTRAMUSCULAR | Status: AC
  Administered 2016-07-24: 4 mg via INTRAVENOUS
  Filled 2016-07-24: qty 2

## 2016-07-24 MED ORDER — PROMETHAZINE HCL 25 MG PO TABS: 25.0000 mg | ORAL_TABLET | Freq: Three times a day (TID) | ORAL | 0 refills | Status: AC | PRN

## 2016-07-24 NOTE — ED Provider Notes (Signed)
[facility] Provider Note   CSN: [id number] Arrival date & time: 07/24/16  [id number]     History   Chief Complaint Chief Complaint  Patient presents with  . Pelvic Pain  . Emesis    HPI Carla Winters is a 46 y.o. female.  HPI Patient presents to the emergency department with lower pelvic pain that started around 2 AM with 3 episodes of vomiting this morning.  The patient states the pain got worse this morning.  She states she took ibuprofen without relief of her symptoms.  The patient states she was work when the pain escalated.  She states that nothing seems make the condition better.  Palpation makes the pain worse.  Patient states she did speak recently with her GYN doctor about the uterine fibroid.  Because it has gotten larger.  He given her several options of caring for this.  The patient states she did start her period this morning as well.  The patient denies chest pain, shortness of breath, headache,blurred vision, neck pain, fever, cough, weakness, numbness, dizziness, anorexia, edema,  diarrhea, rash, back pain, dysuria, hematemesis, bloody stool, near syncope, or syncope. Past Medical History:  Diagnosis Date  . Uterine fibroid 2015    There are no active problems to display for this patient.   Past Surgical History:  Procedure Laterality Date  . TUBAL LIGATION  1998    OB History    No data available       Home Medications    Prior to Admission medications   Medication Sig Start Date End Date Taking? Authorizing Provider  Multiple Vitamins-Minerals (MULTIVITAMIN ADULT PO) Take by mouth.   Yes Historical Provider, MD  acetaminophen (TYLENOL) 500 MG tablet Take 2 tablets (1,000 mg total) by mouth every 6 (six) hours as needed. 08/11/15   Charlesetta Shanks, MD  iron polysaccharides (NIFEREX) 150 MG capsule Take 1 capsule (150 mg total) by mouth daily. 08/11/15   Charlesetta Shanks, MD  traMADol (ULTRAM) 50 MG tablet Take 2 tablets (100 mg total) by mouth every 6  (six) hours as needed for moderate pain (1-2 tablets every 6 hours as needed.). 08/11/15   Charlesetta Shanks, MD    Family History No family history on file.  Social History Social History  Substance Use Topics  . Smoking status: Current Every Day Smoker    Packs/day: 0.50    Types: Cigarettes  . Smokeless tobacco: Never Used  . Alcohol use Yes     Comment: 1/mo     Allergies   Patient has no known allergies.   Review of Systems Review of Systems All other systems negative except as documented in the HPI. All pertinent positives and negatives as reviewed in the HPI.  Physical Exam Updated Vital Signs BP 143/88 (BP Location: Right Arm)   Pulse 62   Temp 98.2 F (36.8 C) (Oral)   Resp 18   Ht 5\' 6"  (1.676 m)   Wt 59 kg   LMP 07/24/2016 (Exact Date)   SpO2 100%   BMI 20.98 kg/m   Physical Exam  Constitutional: She is oriented to person, place, and time. She appears well-developed and well-nourished. No distress.  HENT:  Head: Normocephalic and atraumatic.  Mouth/Throat: Oropharynx is clear and moist.  Eyes: Pupils are equal, round, and reactive to light.  Neck: Normal range of motion. Neck supple.  Cardiovascular: Normal rate, regular rhythm and normal heart sounds.  Exam reveals no gallop and no friction rub.   No murmur heard.  Pulmonary/Chest: Effort normal and breath sounds normal. No respiratory distress. She has no wheezes.  Abdominal: Soft. Bowel sounds are normal. She exhibits no distension and no mass. There is tenderness in the suprapubic area. There is no rebound and no guarding.    Neurological: She is alert and oriented to person, place, and time. She exhibits normal muscle tone. Coordination normal.  Skin: Skin is warm and dry. No rash noted. No erythema.  Psychiatric: She has a normal mood and affect. Her behavior is normal.  Nursing note and vitals reviewed.    ED Treatments / Results  Labs (all labs ordered are listed, but only abnormal results  are displayed) Labs Reviewed  COMPREHENSIVE METABOLIC PANEL - Abnormal; Notable for the following:       Result Value   Glucose, Bld 102 (*)    Calcium 8.8 (*)    ALT 13 (*)    All other components within normal limits  CBC WITH DIFFERENTIAL/PLATELET - Abnormal; Notable for the following:    WBC 10.7 (*)    Neutro Abs 8.8 (*)    All other components within normal limits  URINALYSIS, ROUTINE W REFLEX MICROSCOPIC - Abnormal; Notable for the following:    Hgb urine dipstick MODERATE (*)    All other components within normal limits  URINALYSIS, MICROSCOPIC (REFLEX) - Abnormal; Notable for the following:    Bacteria, UA MANY (*)    Squamous Epithelial / LPF 0-5 (*)    All other components within normal limits  PREGNANCY, URINE    EKG  EKG Interpretation None       Radiology Ct Renal Stone Study  Result Date: 07/24/2016 CLINICAL DATA:  46 year old female with pelvic pain since 0200 hours, and vomiting. Initial encounter. Known Fibroid uterus. EXAM: CT ABDOMEN AND PELVIS WITHOUT CONTRAST TECHNIQUE: Multidetector CT imaging of the abdomen and pelvis was performed following the standard protocol without IV contrast. COMPARISON:  Pelvis ultrasound 08/11/2015. FINDINGS: Lower chest: Negative lung bases. No pericardial or pleural effusion. Hepatobiliary: Negative noncontrast liver and gallbladder. Pancreas: Negative. Spleen: Negative. Adrenals/Urinary Tract: Normal adrenal glands. Hyperdense bilateral renal pyramids without discrete nephrolithiasis. No perinephric stranding. No proximal hydroureter or periureteral stranding. The urinary bladder is decompressed. No abdominal free fluid. Stomach/Bowel: Retained stool in the rectum. Negative sigmoid colon. Negative left colon. Mild retained stool in the transverse colon. Mild transverse colon redundancy. Retained stool in the right colon. Normal retrocecal appendix; although there is a trace amount of right lower quadrant free fluid the adjacent  appendix contains gas and is normal (series 2, image 55, and coronal image 26). No dilated small bowel. Relatively decompressed stomach and duodenum. Vascular/Lymphatic: Calcified aortic atherosclerosis. Vascular patency is not evaluated in the absence of IV contrast. Reproductive: Enlarged rounded uterus measuring 8 x 9 x 10 cm (AP by transverse by CC). No discrete uterine mass by noncontrast CT. The add neck is are not clearly delineated, the other parametrial soft tissues appear within normal limits. Other: Small volume pelvic free fluid, likely physiologic. Musculoskeletal: Negative. IMPRESSION: 1. Small volume of pelvic and right lower quadrant free fluid is nonspecific but most likely physiologic. The appendix is normal. 2. Fibroid uterus, with uterine enlargement up to 10 cm. 3.  Calcified aortic atherosclerosis. Electronically Signed   By: Genevie Ann M.D.   On: 07/24/2016 11:16    Procedures Procedures (including critical care time)  Medications Ordered in ED Medications  sodium chloride 0.9 % bolus 1,000 mL (0 mLs Intravenous Stopped 07/24/16 1115)  ondansetron (  ZOFRAN) injection 4 mg (4 mg Intravenous Given 07/24/16 1028)  morphine 4 MG/ML injection 4 mg (4 mg Intravenous Given 07/24/16 1032)  ketorolac (TORADOL) 30 MG/ML injection 30 mg (30 mg Intravenous Given 07/24/16 1133)     Initial Impression / Assessment and Plan / ED Course  I have reviewed the triage vital signs and the nursing notes.  Pertinent labs & imaging results that were available during my care of the patient were reviewed by me and considered in my medical decision making (see chart for details).     Patient is given IV fluids and pain medication here in the emergency department with complete resolution of her symptoms.  The patient's CT scan shows the large uterine fibroid which I think is affecting the pain along with her menstrual cycle.  Patient most likely had 3 episodes of vomiting due to the pain but she states that it  seemed that this occurred with waves of pain.  The patient will be advised to follow back up with her GYN doctor for further care.  About this uterine fibroid issue.  Patient agrees the plan and all questions were answered  Final Clinical Impressions(s) / ED Diagnoses   Final diagnoses:  None    New Prescriptions New Prescriptions   No medications on file     Dalia Heading, PA-C 07/24/16 [place], MD 07/24/16 [phone]

## 2016-07-24 NOTE — Discharge Instructions (Signed)
Return here as needed.  Follow-up with your GYN doctor.  Slowly increase her fluid intake and rest as much as possible

## 2016-07-24 NOTE — ED Triage Notes (Signed)
Pt reports pelvic pain that began around 0200. Reports emesis x3 (denies blood). Denies fever, diarrhea, genitourinary symptoms. Reports hx of uterine fibroids and menstrual cycle began today. Reports taking 4 OTC Motrin around 0700.

## 2017-12-16 IMAGING — CT CT RENAL STONE PROTOCOL
2 of 4 series · 16 of 46 positions shown, 18 images · non-contrast
Comparison: Pelvis ultrasound 08/11/2015.

CLINICAL DATA: 45-year-old female with pelvic pain since 7977
hours, and vomiting. Initial encounter. Known Fibroid uterus.

EXAM:
CT ABDOMEN AND PELVIS WITHOUT CONTRAST
TECHNIQUE: Multidetector CT imaging of the abdomen and pelvis was performed
following the standard protocol without IV contrast.

[Series 2: axial st · axial · 0.63mm/px · z∈[-498,-88]mm · 13 of 90 slices shown, 15 images]
[im 4/90  soft-tissue]
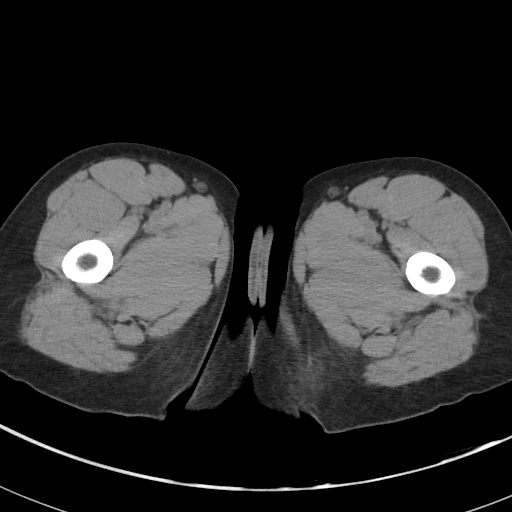
[im 4/90  bone]
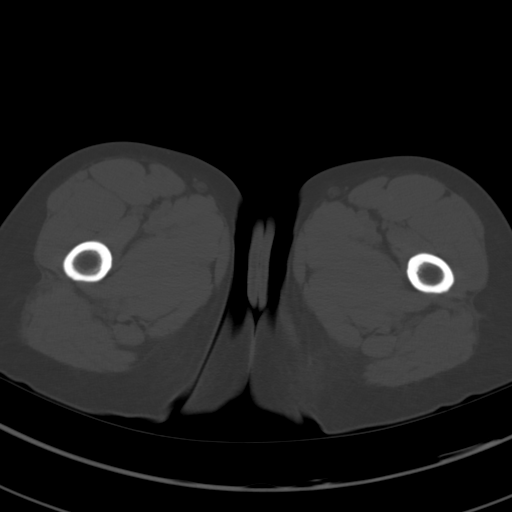
[im 12/90  soft-tissue]
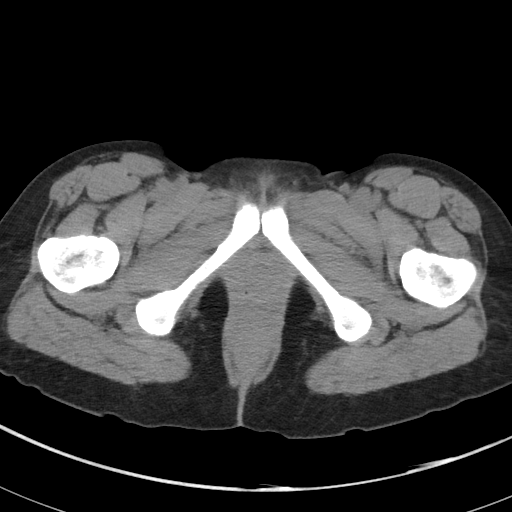
[im 19/90  soft-tissue]
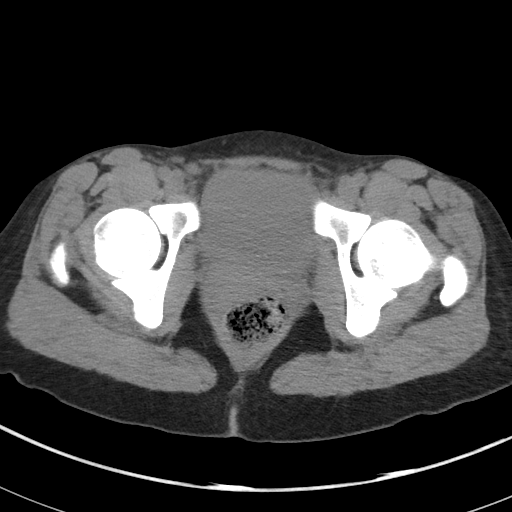
[im 26/90  soft-tissue]
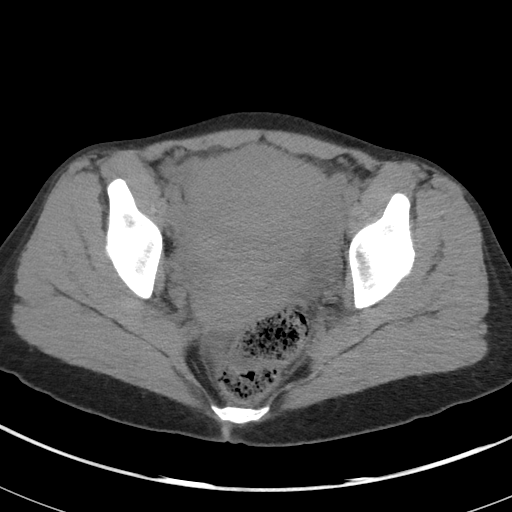
[im 30/90  soft-tissue]
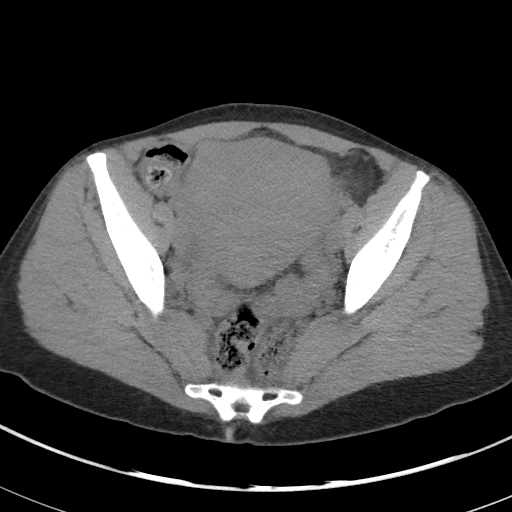
[im 38/90  soft-tissue]
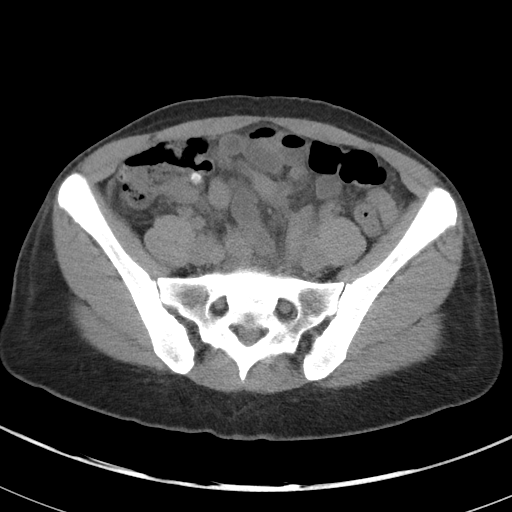
[im 45/90  soft-tissue]
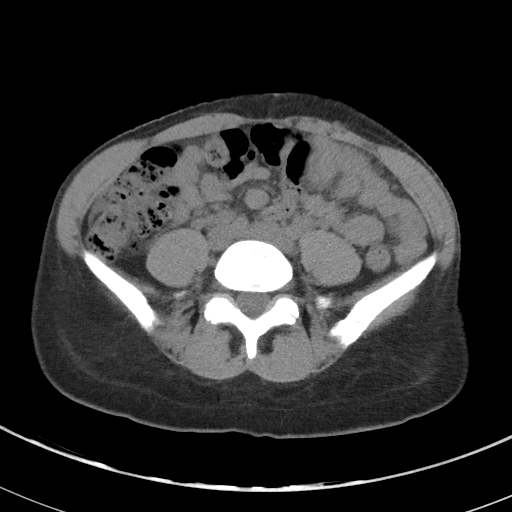
[im 52/90  soft-tissue]
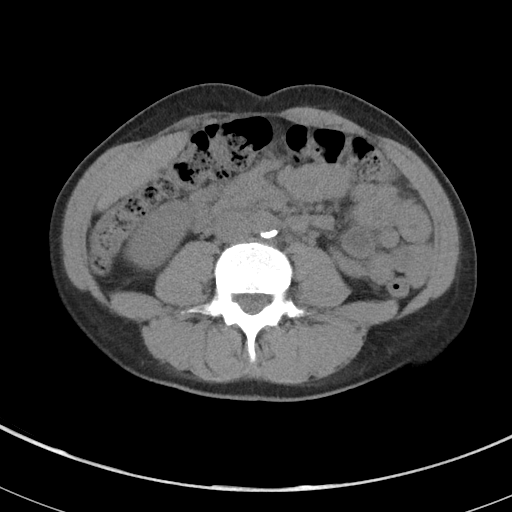
[im 60/90  soft-tissue]
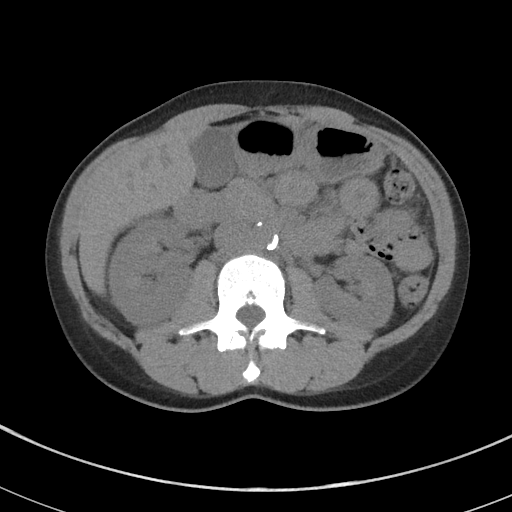
[im 60/90  bone]
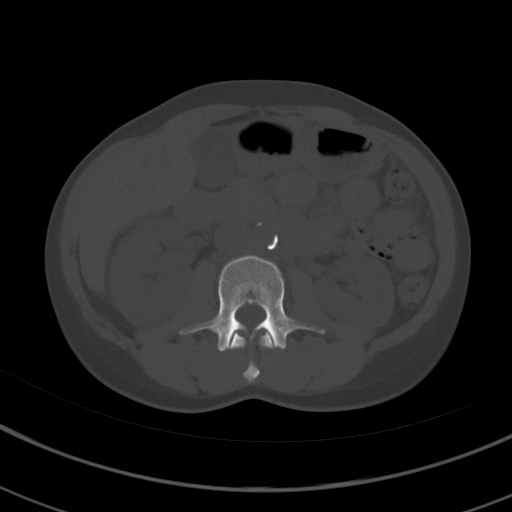
[im 64/90  soft-tissue]
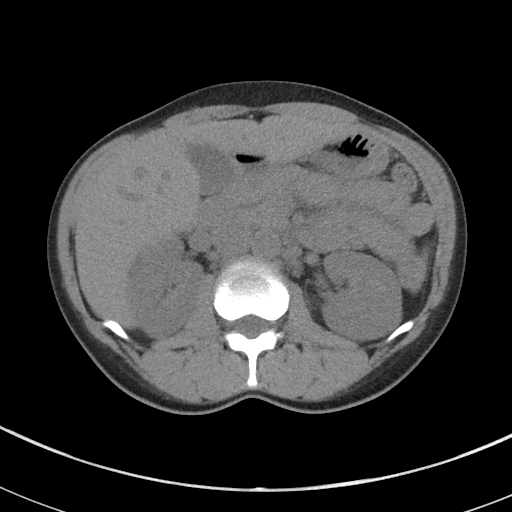
[im 71/90  soft-tissue]
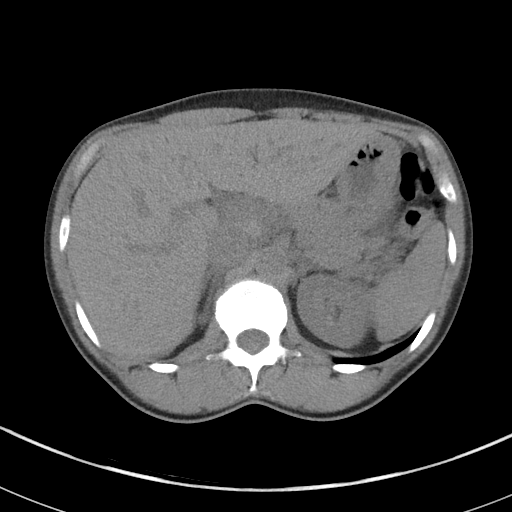
[im 78/90  soft-tissue]
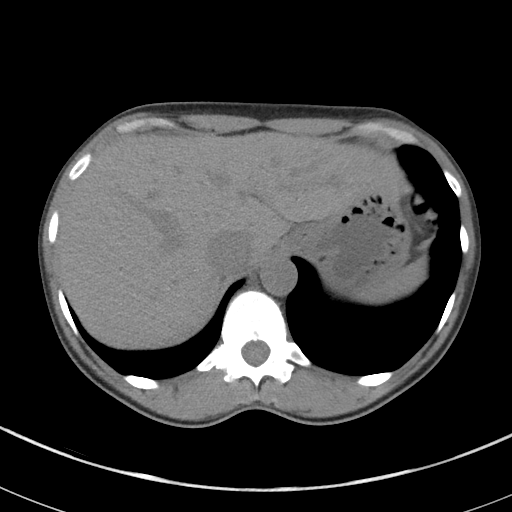
[im 86/90  soft-tissue]
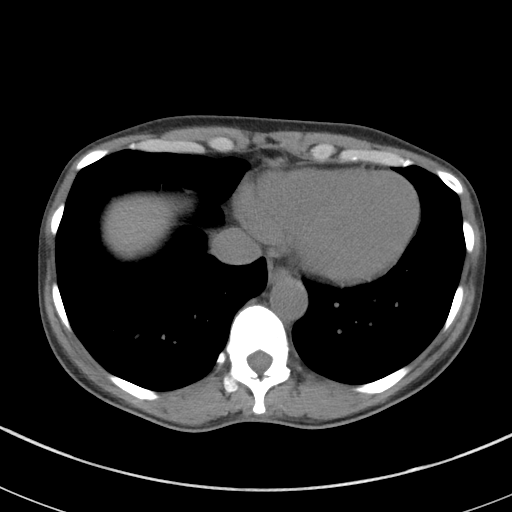

[Series 5: coronal st · coronal · 0.76mm/px · 3 of 82 slices shown]
[im 28/82  soft-tissue]
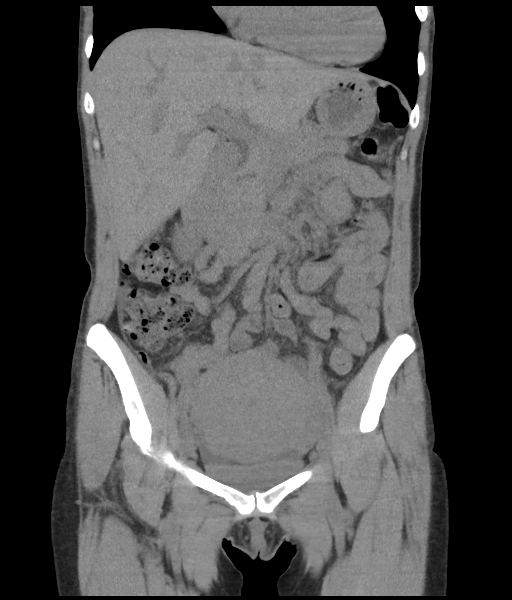
[im 37/82  soft-tissue]
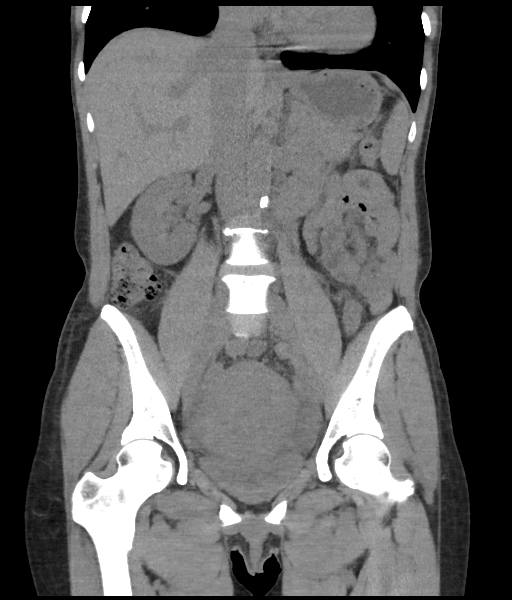
[im 46/82  soft-tissue]
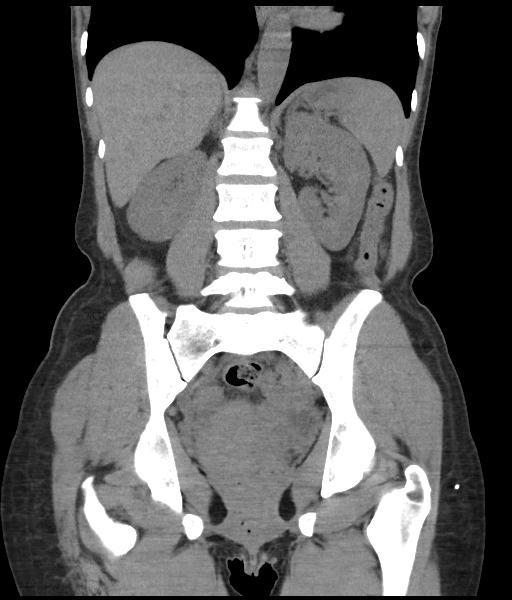

[16 of 46 positions shown; findings below may reference images not displayed]

FINDINGS: Lower chest: Negative lung bases. No pericardial or pleural
effusion.

Hepatobiliary: Negative noncontrast liver and gallbladder.

Pancreas: Negative.

Spleen: Negative.

Adrenals/Urinary Tract: Normal adrenal glands.

Hyperdense bilateral renal pyramids without discrete
nephrolithiasis. No perinephric stranding. No proximal hydroureter
or periureteral stranding. The urinary bladder is decompressed.

No abdominal free fluid.

Stomach/Bowel: Retained stool in the rectum. Negative sigmoid colon.
Negative left colon. Mild retained stool in the transverse colon.
Mild transverse colon redundancy. Retained stool in the right colon.
Normal retrocecal appendix; although there is a trace amount of
right lower quadrant free fluid the adjacent appendix contains gas
and is normal (series 2, image 55, and coronal image 26). No dilated
small bowel. Relatively decompressed stomach and duodenum.

Vascular/Lymphatic: Calcified aortic atherosclerosis. Vascular
patency is not evaluated in the absence of IV contrast.

Reproductive: Enlarged rounded uterus measuring 8 x 9 x 10 cm (AP by
transverse by CC). No discrete uterine mass by noncontrast CT. The
add neck is are not clearly delineated, the other parametrial soft
tissues appear within normal limits.

Other: Small volume pelvic free fluid, likely physiologic.

Musculoskeletal: Negative.
IMPRESSION: 1. Small volume of pelvic and right lower quadrant free fluid is
nonspecific but most likely physiologic. The appendix is normal.
2. Fibroid uterus, with uterine enlargement up to 10 cm.
3.  Calcified aortic atherosclerosis.
